# Patient Record
Sex: Female | Born: 1990
Health system: Southern US, Community
[De-identification: ages and names within clinical notes are randomized; demographics above are authoritative.]

---

## 2015-11-15 NOTE — L&D Delivery Note (Signed)
Operative Delivery Note At 10:43 AM a healthy female was delivered via Vaginal, Vacuum Investment banker, operational(Extractor).  Presentation: vertex; Position: Left,; Station: +4.  Easy application.  Pulls in safety zone over 2 UCs.  Successful assisted vaginal delivery.  Verbal consent: obtained from patient.  Risks and benefits discussed in detail.  Risks include, but are not limited to the risks of anesthesia, bleeding, infection, damage to maternal tissues, fetal cephalhematoma.  There is also the risk of inability to effect vaginal delivery of the head, or shoulder dystocia that cannot be resolved by established maneuvers, leading to the need for emergency cesarean section.  APGAR: 9, 9; weight 6 lb 15.5 oz (3160 g).   Placenta status: complete, 3 Vs.   Cord:  with the following complications: .  Cord pH: N/A  Anesthesia: Epidural Instruments: Bell Shape Vacuum Episiotomy: None Lacerations: 2nd degree Suture Repair: 2.0 3.0 vicryl rapide Est. Blood Loss (mL): 220  Mom to postpartum.  Baby to Couplet care / Skin to Skin.  Isaac Dubie,[name] 08/21/2016, 12:36 PM

## 2015-12-21 ENCOUNTER — Encounter (HOSPITAL_BASED_OUTPATIENT_CLINIC_OR_DEPARTMENT_OTHER): Payer: Self-pay | Admitting: *Deleted

## 2015-12-21 ENCOUNTER — Emergency Department (HOSPITAL_BASED_OUTPATIENT_CLINIC_OR_DEPARTMENT_OTHER): Payer: Self-pay

## 2015-12-21 ENCOUNTER — Emergency Department (HOSPITAL_BASED_OUTPATIENT_CLINIC_OR_DEPARTMENT_OTHER)
Admission: EM | Admit: 2015-12-21 | Discharge: 2015-12-21 | Disposition: A | Discharge status: Home / Self Care | Payer: Self-pay | Attending: Emergency Medicine | Admitting: Emergency Medicine

## 2015-12-21 DIAGNOSIS — N3000 Acute cystitis without hematuria: Secondary | ICD-10-CM | POA: Insufficient documentation

## 2015-12-21 DIAGNOSIS — Z331 Pregnant state, incidental: Secondary | ICD-10-CM | POA: Insufficient documentation

## 2015-12-21 DIAGNOSIS — R102 Pelvic and perineal pain: Secondary | ICD-10-CM

## 2015-12-21 DIAGNOSIS — R1110 Vomiting, unspecified: Secondary | ICD-10-CM | POA: Insufficient documentation

## 2015-12-21 DIAGNOSIS — O26899 Other specified pregnancy related conditions, unspecified trimester: Secondary | ICD-10-CM

## 2015-12-21 LAB — BASIC METABOLIC PANEL
Anion gap: 11 (ref 5–15)
BUN: 7 mg/dL (ref 6–20)
CO2: 22 mmol/L (ref 22–32)
Calcium: 8.9 mg/dL (ref 8.9–10.3)
Chloride: 104 mmol/L (ref 101–111)
Creatinine, Ser: 0.6 mg/dL (ref 0.44–1.00)
GFR calc Af Amer: >60 mL/min (ref >60)
GFR calc non Af Amer: >60 mL/min (ref >60)
Glucose, Bld: 88 mg/dL (ref 65–99)
Potassium: 3.5 mmol/L (ref 3.5–5.1)
Sodium: 137 mmol/L (ref 135–145)

## 2015-12-21 LAB — CBC WITH DIFFERENTIAL/PLATELET
Basophils Absolute: 0 10*3/uL (ref 0.0–0.1)
Basophils Relative: 0 %
Eosinophils Absolute: 0 10*3/uL (ref 0.0–0.7)
Eosinophils Relative: 0 %
HCT: 37.9 % (ref 36.0–46.0)
Hemoglobin: 12.5 g/dL (ref 12.0–15.0)
Lymphocytes Relative: 15 %
Lymphs Abs: 1.2 10*3/uL (ref 0.7–4.0)
MCH: 27.2 pg (ref 26.0–34.0)
MCHC: 33 g/dL (ref 30.0–36.0)
MCV: 82.6 fL (ref 78.0–100.0)
Monocytes Absolute: 0.5 10*3/uL (ref 0.1–1.0)
Monocytes Relative: 6 %
Neutro Abs: 6.1 10*3/uL (ref 1.7–7.7)
Neutrophils Relative %: 79 %
Platelets: 231 10*3/uL (ref 150–400)
RBC: 4.59 MIL/uL (ref 3.87–5.11)
RDW: 12.5 % (ref 11.5–15.5)
WBC: 7.8 10*3/uL (ref 4.0–10.5)

## 2015-12-21 LAB — URINALYSIS, ROUTINE W REFLEX MICROSCOPIC
Glucose, UA: NEGATIVE mg/dL
HGB URINE DIPSTICK: NEGATIVE
Ketones, ur: >80 mg/dL — AB
NITRITE: POSITIVE — AB
PROTEIN: NEGATIVE mg/dL
Specific Gravity, Urine: 1.026 (ref 1.005–1.030)
pH: 6 (ref 5.0–8.0)

## 2015-12-21 LAB — URINE MICROSCOPIC-ADD ON

## 2015-12-21 LAB — PREGNANCY, URINE: PREG TEST UR: POSITIVE — AB

## 2015-12-21 LAB — HCG, QUANTITATIVE, PREGNANCY: hCG, Beta Chain, Quant, S: 65990 m[IU]/mL — ABNORMAL HIGH (ref <5)

## 2015-12-21 MED ORDER — ACETAMINOPHEN 325 MG PO TABS
650.0000 mg | ORAL_TABLET | Freq: Once | ORAL | Status: AC
  Administered 2015-12-21: 650 mg via ORAL
  Filled 2015-12-21: qty 2

## 2015-12-21 MED ORDER — CEPHALEXIN 500 MG PO CAPS: 1000.0000 mg | ORAL_CAPSULE | Freq: Two times a day (BID) | ORAL | Status: DC

## 2015-12-21 MED ORDER — SODIUM CHLORIDE 0.9 % IV BOLUS (SEPSIS)
1000.0000 mL | Freq: Once | INTRAVENOUS | Status: AC
  Administered 2015-12-21: 1000 mL via INTRAVENOUS

## 2015-12-21 MED FILL — CEPHALEXIN 500 MG CAPSULE: 500 | 7 days supply | Qty: 28 | Fill #0

## 2015-12-21 NOTE — ED Notes (Signed)
Patient transported to Ultrasound 

## 2015-12-21 NOTE — ED Notes (Signed)
PT c/o of abd pain. PA aware. Orders given.

## 2015-12-21 NOTE — ED Notes (Signed)
Color started AFTER starting taking AZO tablets for painful urination.

## 2015-12-21 NOTE — Discharge Instructions (Signed)
Return here as needed.  Follow-up with your OB/GYN or the one provided

## 2015-12-21 NOTE — ED Provider Notes (Signed)
CSN: [id number]     Arrival date & time 12/21/15  1004 History   First MD Initiated Contact with Patient 12/21/15 1044     No chief complaint on file.    (Consider location/radiation/quality/duration/timing/severity/associated sxs/prior Treatment) HPI Patient presents to the emergency department with lower abdominal discomfort with dysuria.  Patient states that ongoing for 10 days.  Patient states that she also has had some vomiting in the mornings as well.  Patient denies chest pain, shortness breath, back pain, neck pain, fever, incontinence, hematemesis, bloody stool, edema, vaginal bleeding, vaginal discharge or syncope.  The patient states that nothing seems make her condition better or worse.  She did not take any medications at home  History reviewed. No pertinent past medical history. History reviewed. No pertinent past surgical history. No family history on file. Social History  Substance Use Topics  . Smoking status: Never Smoker   . Smokeless tobacco: None  . Alcohol Use: None   OB History    No data available     Review of Systems All other systems negative except as documented in the HPI. All pertinent positives and negatives as reviewed in the HPI.   Allergies  Review of patient's allergies indicates no known allergies.  Home Medications   Prior to Admission medications   Not on File   BP 98/51 mmHg  Pulse 89  Temp(Src) 98.2 F (36.8 C) (Oral)  Resp 18  Ht  (1.727 m)  Wt 46.811 kg  BMI 15.70 kg/m2  SpO2 100%  LMP 11/09/2015 Physical Exam  Constitutional: She is oriented to person, place, and time. She appears well-developed and well-nourished. No distress.  HENT:  Head: Normocephalic and atraumatic.  Mouth/Throat: Oropharynx is clear and moist.  Eyes: Pupils are equal, round, and reactive to light.  Neck: Normal range of motion. Neck supple.  Cardiovascular: Normal rate, regular rhythm and normal heart sounds.  Exam reveals no gallop and no  friction rub.   No murmur heard. Pulmonary/Chest: Effort normal and breath sounds normal. No respiratory distress. She has no wheezes.  Abdominal: Soft. Bowel sounds are normal. She exhibits no distension. There is tenderness. There is no rebound and no guarding.  Neurological: She is alert and oriented to person, place, and time. She exhibits normal muscle tone. Coordination normal.  Skin: Skin is warm and dry. No rash noted. No erythema.  Psychiatric: She has a normal mood and affect. Her behavior is normal.  Nursing note and vitals reviewed.   ED Course  Procedures (including critical care time) Labs Review Labs Reviewed  URINALYSIS, ROUTINE W REFLEX MICROSCOPIC (NOT AT [facility]) - Abnormal; Notable for the following:    Color, Urine RED (*)    APPearance CLOUDY (*)    Bilirubin Urine SMALL (*)    Ketones, ur >80 (*)    Nitrite POSITIVE (*)    Leukocytes, UA MODERATE (*)    All other components within normal limits  PREGNANCY, URINE - Abnormal; Notable for the following:    Preg Test, Ur POSITIVE (*)    All other components within normal limits  URINE MICROSCOPIC-ADD ON - Abnormal; Notable for the following:    Squamous Epithelial / LPF 6-30 (*)    Bacteria, UA MANY (*)    All other components within normal limits  HCG, QUANTITATIVE, PREGNANCY - Abnormal; Notable for the following:    hCG, Beta Chain, Quant, S 29528 (*)    All other components within normal limits  URINE CULTURE  CBC WITH  DIFFERENTIAL/PLATELET  BASIC METABOLIC PANEL    Imaging Review US Ob Comp Less 14 Wks  12/21/2015  CLINICAL DATA:  Dysuria for 10 days EXAM: OBSTETRIC <14 WK [place] AND TRANSVAGINAL OB US TECHNIQUE: Both transabdominal and transvaginal ultrasound examinations were performed for complete evaluation of the gestation as well as the maternal uterus, adnexal regions, and pelvic cul-de-sac. Transvaginal technique was performed to assess early pregnancy. COMPARISON:  None. FINDINGS: Intrauterine  gestational sac: Visualized/normal in shape. Yolk sac:  Visualized Embryo:  Visualized Cardiac Activity: Visualized Heart Rate: 116  bpm MSD:   mm    w     d CRL:  4.7  mm   6 w   1 d                  [place] EDC: 08/14/2016 Subchorionic hemorrhage:  No subchorionic hemorrhage. Maternal uterus/adnexae: No adnexal masses.  Trace free fluid. IMPRESSION: Six week 1 day intrauterine pregnancy. Fetal heart rate 160 beats per minute. No acute maternal findings. Electronically Signed   By: Charlett Nose M.D.   On: 12/21/2015 12:44   US Ob Transvaginal  12/21/2015  CLINICAL DATA:  Dysuria for 10 days EXAM: OBSTETRIC <14 WK [place] AND TRANSVAGINAL OB US TECHNIQUE: Both transabdominal and transvaginal ultrasound examinations were performed for complete evaluation of the gestation as well as the maternal uterus, adnexal regions, and pelvic cul-de-sac. Transvaginal technique was performed to assess early pregnancy. COMPARISON:  None. FINDINGS: Intrauterine gestational sac: Visualized/normal in shape. Yolk sac:  Visualized Embryo:  Visualized Cardiac Activity: Visualized Heart Rate: 116  bpm MSD:   mm    w     d CRL:  4.7  mm   6 w   1 d                  [place] EDC: 08/14/2016 Subchorionic hemorrhage:  No subchorionic hemorrhage. Maternal uterus/adnexae: No adnexal masses.  Trace free fluid. IMPRESSION: Six week 1 day intrauterine pregnancy. Fetal heart rate 160 beats per minute. No acute maternal findings. Electronically Signed   By: Charlett Nose M.D.   On: 12/21/2015 12:44   I have personally reviewed and evaluated these images and lab results as part of my medical decision-making. The patient will be sent to [facility] for further care and evaluation of her intrauterine pregnancy.  The patient as well as the plan and all questions were answered    Charlestine Night, PA-C 12/21/15 1355  Tilden Fossa, MD 12/22/15 [phone]

## 2015-12-21 NOTE — ED Notes (Signed)
C/o burning on urination and abd pain with n/v x 3 days.

## 2015-12-22 LAB — URINE CULTURE: Culture: 4000

## 2016-02-02 ENCOUNTER — Encounter: Payer: Self-pay | Admitting: Certified Nurse Midwife

## 2016-02-10 LAB — OB RESULTS CONSOLE GC/CHLAMYDIA
Chlamydia: NEGATIVE
Gonorrhea: NEGATIVE

## 2016-05-16 LAB — OB RESULTS CONSOLE ABO/RH: RH Type: POSITIVE

## 2016-05-16 LAB — OB RESULTS CONSOLE RPR: RPR: NONREACTIVE

## 2016-05-16 LAB — OB RESULTS CONSOLE ANTIBODY SCREEN: ANTIBODY SCREEN: NEGATIVE

## 2016-05-16 LAB — OB RESULTS CONSOLE HIV ANTIBODY (ROUTINE TESTING): HIV: NONREACTIVE

## 2016-05-16 LAB — OB RESULTS CONSOLE HEPATITIS B SURFACE ANTIGEN: HEP B S AG: NEGATIVE

## 2016-05-16 LAB — OB RESULTS CONSOLE RUBELLA ANTIBODY, IGM: RUBELLA: IMMUNE

## 2016-06-20 ENCOUNTER — Inpatient Hospital Stay (HOSPITAL_COMMUNITY): Admission: AD | Admit: 2016-06-20 | Payer: Self-pay | Source: Ambulatory Visit | Admitting: Obstetrics & Gynecology

## 2016-07-20 LAB — OB RESULTS CONSOLE GBS: GBS: NEGATIVE

## 2016-08-19 ENCOUNTER — Other Ambulatory Visit: Payer: Self-pay | Admitting: Obstetrics & Gynecology

## 2016-08-20 ENCOUNTER — Encounter (HOSPITAL_COMMUNITY): Payer: Self-pay | Admitting: *Deleted

## 2016-08-20 ENCOUNTER — Inpatient Hospital Stay (HOSPITAL_COMMUNITY): Payer: PRIVATE HEALTH INSURANCE | Admitting: Anesthesiology

## 2016-08-20 ENCOUNTER — Inpatient Hospital Stay (HOSPITAL_COMMUNITY)
Admission: AD | Admit: 2016-08-20 | Discharge: 2016-08-22 | DRG: 775 | Disposition: A | Discharge status: Home / Self Care | Payer: PRIVATE HEALTH INSURANCE | Source: Ambulatory Visit | Attending: Obstetrics & Gynecology | Admitting: Obstetrics & Gynecology

## 2016-08-20 ENCOUNTER — Inpatient Hospital Stay (HOSPITAL_COMMUNITY): Admission: RE | Admit: 2016-08-20 | Payer: PRIVATE HEALTH INSURANCE | Source: Ambulatory Visit

## 2016-08-20 DIAGNOSIS — Z3A40 40 weeks gestation of pregnancy: Secondary | ICD-10-CM

## 2016-08-20 DIAGNOSIS — O480 Post-term pregnancy: Principal | ICD-10-CM | POA: Diagnosis present

## 2016-08-20 DIAGNOSIS — Z8759 Personal history of other complications of pregnancy, childbirth and the puerperium: Secondary | ICD-10-CM

## 2016-08-20 LAB — TYPE AND SCREEN
ABO/RH(D): O POS
ANTIBODY SCREEN: NEGATIVE

## 2016-08-20 LAB — CBC
HEMATOCRIT: 38.9 % (ref 36.0–46.0)
HEMOGLOBIN: 13 g/dL (ref 12.0–15.0)
MCH: 29.2 pg (ref 26.0–34.0)
MCHC: 33.4 g/dL (ref 30.0–36.0)
MCV: 87.4 fL (ref 78.0–100.0)
Platelets: 189 10*3/uL (ref 150–400)
RBC: 4.45 MIL/uL (ref 3.87–5.11)
RDW: 17.7 % — ABNORMAL HIGH (ref 11.5–15.5)
WBC: 8.8 10*3/uL (ref 4.0–10.5)

## 2016-08-20 LAB — ABO/RH: ABO/RH(D): O POS

## 2016-08-20 MED ORDER — LACTATED RINGERS IV SOLN: 500.0000 mL | Freq: Once | INTRAVENOUS | Status: DC

## 2016-08-20 MED ORDER — LIDOCAINE HCL (PF) 1 % IJ SOLN
INTRAMUSCULAR | Status: DC | PRN
  Administered 2016-08-20 (×2): 5 mL

## 2016-08-20 MED ORDER — LIDOCAINE HCL (PF) 1 % IJ SOLN
30.0000 mL | INTRAMUSCULAR | Status: DC | PRN
  Administered 2016-08-21: 30 mL via SUBCUTANEOUS
  Filled 2016-08-20: qty 30

## 2016-08-20 MED ORDER — OXYTOCIN 40 UNITS IN LACTATED RINGERS INFUSION - SIMPLE MED
1.0000 m[IU]/min | INTRAVENOUS | Status: DC
  Administered 2016-08-20: 2 m[IU]/min via INTRAVENOUS
  Filled 2016-08-20: qty 1000

## 2016-08-20 MED ORDER — DIPHENHYDRAMINE HCL 50 MG/ML IJ SOLN
12.5000 mg | INTRAMUSCULAR | Status: DC | PRN
Start: 2016-08-20 — End: 2016-08-20

## 2016-08-20 MED ORDER — EPHEDRINE 5 MG/ML INJ: 10.0000 mg | INTRAVENOUS | Status: DC | PRN

## 2016-08-20 MED ORDER — FENTANYL 2.5 MCG/ML BUPIVACAINE 1/10 % EPIDURAL INFUSION (WH - ANES)
14.0000 mL/h | INTRAMUSCULAR | Status: DC | PRN
  Administered 2016-08-20 – 2016-08-21 (×2): 14 mL/h via EPIDURAL
  Filled 2016-08-20 (×2): qty 125

## 2016-08-20 MED ORDER — LACTATED RINGERS IV SOLN
INTRAVENOUS | Status: DC
  Administered 2016-08-20 (×2): via INTRAVENOUS

## 2016-08-20 MED ORDER — PHENYLEPHRINE 40 MCG/ML (10ML) SYRINGE FOR IV PUSH (FOR BLOOD PRESSURE SUPPORT)
80.0000 ug | PREFILLED_SYRINGE | INTRAVENOUS | Status: DC | PRN
  Filled 2016-08-20: qty 5

## 2016-08-20 MED ORDER — PHENYLEPHRINE 40 MCG/ML (10ML) SYRINGE FOR IV PUSH (FOR BLOOD PRESSURE SUPPORT): 80.0000 ug | PREFILLED_SYRINGE | INTRAVENOUS | Status: DC | PRN

## 2016-08-20 MED ORDER — OXYTOCIN 40 UNITS IN LACTATED RINGERS INFUSION - SIMPLE MED
2.5000 [IU]/h | Freq: Once | INTRAVENOUS | Status: AC | PRN
  Administered 2016-08-21: 2.5 [IU]/h via INTRAVENOUS
  Filled 2016-08-20: qty 1000

## 2016-08-20 MED ORDER — FENTANYL 2.5 MCG/ML BUPIVACAINE 1/10 % EPIDURAL INFUSION (WH - ANES): 14.0000 mL/h | INTRAMUSCULAR | Status: DC | PRN

## 2016-08-20 MED ORDER — OXYTOCIN BOLUS FROM INFUSION
500.0000 mL | Freq: Once | INTRAVENOUS | Status: AC | PRN
  Administered 2016-08-21: 500 mL via INTRAVENOUS

## 2016-08-20 MED ORDER — SOD CITRATE-CITRIC ACID 500-334 MG/5ML PO SOLN: 30.0000 mL | ORAL | Status: DC | PRN

## 2016-08-20 MED ORDER — PHENYLEPHRINE 40 MCG/ML (10ML) SYRINGE FOR IV PUSH (FOR BLOOD PRESSURE SUPPORT)
80.0000 ug | PREFILLED_SYRINGE | INTRAVENOUS | Status: DC | PRN
  Filled 2016-08-20: qty 10
  Filled 2016-08-20: qty 5

## 2016-08-20 MED ORDER — ACETAMINOPHEN 325 MG PO TABS: 650.0000 mg | ORAL_TABLET | ORAL | Status: DC | PRN

## 2016-08-20 MED ORDER — DIPHENHYDRAMINE HCL 50 MG/ML IJ SOLN: 12.5000 mg | INTRAMUSCULAR | Status: DC | PRN

## 2016-08-20 MED ORDER — ONDANSETRON HCL 4 MG/2ML IJ SOLN
4.0000 mg | Freq: Four times a day (QID) | INTRAMUSCULAR | Status: DC | PRN
  Administered 2016-08-21: 4 mg via INTRAVENOUS
  Filled 2016-08-20: qty 2

## 2016-08-20 MED ORDER — TERBUTALINE SULFATE 1 MG/ML IJ SOLN
0.2500 mg | Freq: Once | INTRAMUSCULAR | Status: DC | PRN
  Filled 2016-08-20: qty 1

## 2016-08-20 MED ORDER — EPHEDRINE 5 MG/ML INJ
10.0000 mg | INTRAVENOUS | Status: DC | PRN
  Filled 2016-08-20: qty 4

## 2016-08-20 MED ORDER — LACTATED RINGERS IV SOLN: 500.0000 mL | INTRAVENOUS | Status: DC | PRN

## 2016-08-20 NOTE — H&P (Signed)
Joan Mccarthy is a 25 y.o. female G1P0 2558w5d presenting for Induction Postdates.  HPP/HPI:  Good FMs, no AF leak, no vaginal bleeding, no reg UCs.  No PEC Sx.  Last Ob US 40+ wks AFI wnl, BPP 8/8.  US 39+ wks EFW 47%.  OB History    Gravida Para Term Preterm AB Living   1             SAB TAB Ectopic Multiple Live Births                 History reviewed. No pertinent past medical history. No past surgical history on file. Family History: family history is not on file. Social History:  reports that she has never smoked. She does not have any smokeless tobacco history on file. She reports that she does not drink alcohol or use drugs.  No Known Allergies  Dilation: 2.5 Effacement (%): 80 Station: -2 Exam by:: [email or web address],rnc-ob   Blood pressure 107/63, pulse 92, temperature 98 F (36.7 C), temperature source Oral, resp. rate 20, height 4\' 10"  (1.473 m), weight 139 lb (63 kg), last menstrual period 11/09/2015. Exam Physical Exam   FHR 130's with good variability, accelerations present, no deceleration. UCs irregular, mild.  HPP:  Patient Active Problem List   Diagnosis Date Noted  . Labor and delivery, indication for care 08/20/2016    Prenatal labs: ABO, Rh: O/Positive/-- (07/03 0000) Antibody: Negative (07/03 0000) Rubella: Immune RPR: Nonreactive (07/03 0000)  HBsAg: Negative (07/03 0000)  HIV: Non-reactive (07/03 0000)  Genetic testing: Quad test neg US anato: wnl, female 1 hr GTT: Abnormal.  3 hr GTT wnl. GBS: Negative (09/06 0000)   Assessment/Plan: 40 5/7 wks G1 for Induction Postdates.  FHR Cat 1.  Pitocin/AROM.  Expectant management towards vaginal delivery.  Risks of C/S previously discussed with Dr Juliene PinaMody given borderline Pelvis.   Joan Mccarthy,Joan Mccarthy 08/20/2016, 8:56 AM

## 2016-08-20 NOTE — Anesthesia Pain Management Evaluation Note (Signed)
  CRNA Pain Management Visit Note  Patient: Kandice MoosSowjanya Barro, 25 y.o., female  "Hello I am a member of the anesthesia team at [facility]. We have an anesthesia team available at all times to provide care throughout the hospital, including epidural management and anesthesia for C-section. I don't know your plan for the delivery whether it a natural birth, water birth, IV sedation, nitrous supplementation, doula or epidural, but we want to meet your pain goals."   1.Was your pain managed to your expectations on prior hospitalizations?    2.What is your expectation for pain management during this hospitalization?     3.How can we help you reach that goal?   Record the patient's initial score and the patient's pain goal.   Pain: 0  Pain Goal: 5 [facility] wants you to be able to say your pain was always managed very well.  Zayveon Raschke Hristova 08/20/2016

## 2016-08-20 NOTE — Anesthesia Procedure Notes (Signed)
Epidural Patient location during procedure: OB Start time: 08/20/2016 10:42 PM End time: 08/20/2016 10:45 PM  Staffing Anesthesiologist: Bonita QuinGUIDETTI, Huntley Knoop S Performed: anesthesiologist   Preanesthetic Checklist Completed: patient identified, site marked, surgical consent, pre-op evaluation, timeout performed, IV checked, risks and benefits discussed and monitors and equipment checked  Epidural Patient position: sitting Prep: site prepped and draped and DuraPrep Patient monitoring: continuous pulse ox and blood pressure Approach: midline Location: L4-L5 Injection technique: LOR air  Needle:  Needle type: Tuohy  Needle gauge: 17 G Needle length: 9 cm and 9 Needle insertion depth: 6 cm Catheter type: closed end flexible Catheter size: 19 Gauge Catheter at skin depth: 11 cm Test dose: negative  Assessment Events: blood not aspirated, injection not painful, no injection resistance, negative IV test and no paresthesia

## 2016-08-20 NOTE — Progress Notes (Signed)
Subjective: Doing well, pain very mile, UCs q3-4 min  Anesthesia none   Objective: BP (!) 112/59   Pulse 90   Temp 98.4 F (36.9 C) (Oral)   Resp 18   Ht 4\' 10"  (1.473 m)   Wt 139 lb (63 kg)   LMP 11/09/2015   BMI 29.05 kg/m    FHT:  FHR: 130's bpm, variability: moderate,  accelerations:  Present,  decelerations:  Absent UC:   regular, every 3-4 minutes VE:   Dilation: 3 Effacement (%): 80 Station: -2 Exam by:: dr Seymour BarsLavoie   Assessment / Plan: Induction of labor due to postterm,  progressing well on pitocin.  Early labor.  Will reevaluate for AROM at next exam.  Patient prefers not to AROM at this time.  Fetal Wellbeing:  Category I Pain Control:  Labor support without medications/Nitrous Oxide PRN/Epidural PRN.  Anticipated MOD:  NSVD  Latha Staunton,[name] 08/20/2016, 12:09 PM

## 2016-08-20 NOTE — Anesthesia Preprocedure Evaluation (Signed)
Anesthesia Evaluation  Patient identified by MRN, date of birth, ID band Patient awake    Reviewed: Allergy & Precautions, NPO status , Patient's Chart, lab work & pertinent test results  Airway Mallampati: II       Dental no notable dental hx.    Pulmonary neg pulmonary ROS,    Pulmonary exam normal        Cardiovascular negative cardio ROS Normal cardiovascular exam     Neuro/Psych negative neurological ROS  negative psych ROS   GI/Hepatic negative GI ROS, Neg liver ROS,   Endo/Other  negative endocrine ROS  Renal/GU negative Renal ROS  negative genitourinary   Musculoskeletal negative musculoskeletal ROS (+)   Abdominal   Peds negative pediatric ROS (+)  Hematology negative hematology ROS (+)   Anesthesia Other Findings   Reproductive/Obstetrics (+) Pregnancy                             Anesthesia Physical Anesthesia Plan  ASA: II  Anesthesia Plan: Epidural   Post-op Pain Management:    Induction:   Airway Management Planned:   Additional Equipment:   Intra-op Plan:   Post-operative Plan:   Informed Consent:   Plan Discussed with:   Anesthesia Plan Comments:         Anesthesia Quick Evaluation  

## 2016-08-20 NOTE — Progress Notes (Signed)
Subjective: Doing well, pain mild, UCs q3-4  Anesthesia none   Objective: BP 107/61   Pulse 100   Temp 98.6 F (37 C) (Oral)   Resp 18   Ht 4\' 10"  (1.473 m)   Wt 139 lb (63 kg)   LMP 11/09/2015   BMI 29.05 kg/m    FHT:  FHR: 130's bpm, variability: moderate,  accelerations:  Present,  decelerations:  Absent UC:   regular, every 3-4 minutes VE:   Dilation: 3 Effacement (%): 90 Station: -1, -2 Exam by:: dr Seymour Barslavoie  AROM AF clear ++   Assessment / Plan: Induction with Pitocin, still in early labor.  AROM clear AF.  Fetal Wellbeing:  Category I Pain Control:  Labor support without medications  Anticipated MOD:  NSVD  Briley Sulton,[name] 08/20/2016, 4:15 PM

## 2016-08-21 ENCOUNTER — Encounter (HOSPITAL_COMMUNITY): Payer: Self-pay | Admitting: *Deleted

## 2016-08-21 LAB — RPR: RPR: NONREACTIVE

## 2016-08-21 MED ORDER — DIPHENHYDRAMINE HCL 25 MG PO CAPS: 25.0000 mg | ORAL_CAPSULE | Freq: Four times a day (QID) | ORAL | Status: DC | PRN

## 2016-08-21 MED ORDER — METHYLERGONOVINE MALEATE 0.2 MG PO TABS
0.2000 mg | ORAL_TABLET | Freq: Once | ORAL | Status: AC
  Administered 2016-08-21: 0.2 mg via ORAL
  Filled 2016-08-21: qty 1

## 2016-08-21 MED ORDER — TETANUS-DIPHTH-ACELL PERTUSSIS 5-2.5-18.5 LF-MCG/0.5 IM SUSP
0.5000 mL | Freq: Once | INTRAMUSCULAR | Status: AC
  Administered 2016-08-22: 0.5 mL via INTRAMUSCULAR

## 2016-08-21 MED ORDER — ONDANSETRON HCL 4 MG PO TABS: 4.0000 mg | ORAL_TABLET | ORAL | Status: DC | PRN

## 2016-08-21 MED ORDER — COCONUT OIL OIL: 1.0000 "application " | TOPICAL_OIL | Status: DC | PRN

## 2016-08-21 MED ORDER — DIBUCAINE 1 % RE OINT: 1.0000 "application " | TOPICAL_OINTMENT | RECTAL | Status: DC | PRN

## 2016-08-21 MED ORDER — OXYTOCIN 40 UNITS IN LACTATED RINGERS INFUSION - SIMPLE MED: 2.5000 [IU]/h | INTRAVENOUS | Status: DC | PRN

## 2016-08-21 MED ORDER — IBUPROFEN 600 MG PO TABS
600.0000 mg | ORAL_TABLET | Freq: Four times a day (QID) | ORAL | Status: DC
  Administered 2016-08-21 – 2016-08-22 (×5): 600 mg via ORAL
  Filled 2016-08-21 (×6): qty 1

## 2016-08-21 MED ORDER — OXYTOCIN 40 UNITS IN LACTATED RINGERS INFUSION - SIMPLE MED: 1.0000 m[IU]/min | INTRAVENOUS | Status: DC

## 2016-08-21 MED ORDER — SIMETHICONE 80 MG PO CHEW: 80.0000 mg | CHEWABLE_TABLET | ORAL | Status: DC | PRN

## 2016-08-21 MED ORDER — WITCH HAZEL-GLYCERIN EX PADS: 1.0000 "application " | MEDICATED_PAD | CUTANEOUS | Status: DC | PRN

## 2016-08-21 MED ORDER — ACETAMINOPHEN 325 MG PO TABS: 650.0000 mg | ORAL_TABLET | ORAL | Status: DC | PRN

## 2016-08-21 MED ORDER — SENNOSIDES-DOCUSATE SODIUM 8.6-50 MG PO TABS
2.0000 | ORAL_TABLET | ORAL | Status: DC
  Administered 2016-08-21: 2 via ORAL
  Filled 2016-08-21: qty 2

## 2016-08-21 MED ORDER — BENZOCAINE-MENTHOL 20-0.5 % EX AERO
1.0000 "application " | INHALATION_SPRAY | CUTANEOUS | Status: DC | PRN
  Administered 2016-08-22: 1 via TOPICAL
  Filled 2016-08-21 (×2): qty 56

## 2016-08-21 MED ORDER — ONDANSETRON HCL 4 MG/2ML IJ SOLN: 4.0000 mg | INTRAMUSCULAR | Status: DC | PRN

## 2016-08-21 MED ORDER — METHYLERGONOVINE MALEATE 0.2 MG/ML IJ SOLN
0.2000 mg | Freq: Once | INTRAMUSCULAR | Status: AC
  Administered 2016-08-21: 0.2 mg via INTRAMUSCULAR

## 2016-08-21 MED ORDER — PRENATAL MULTIVITAMIN CH
1.0000 | ORAL_TABLET | Freq: Every day | ORAL | Status: DC
  Administered 2016-08-21 – 2016-08-22 (×2): 1 via ORAL
  Filled 2016-08-21 (×2): qty 1

## 2016-08-21 MED ORDER — ZOLPIDEM TARTRATE 5 MG PO TABS: 5.0000 mg | ORAL_TABLET | Freq: Every evening | ORAL | Status: DC | PRN

## 2016-08-21 NOTE — Progress Notes (Signed)
Epidural decreased to 7010ml/hr per dr Council Mechanicdenenny = dr Seymour Barslavoie aware

## 2016-08-21 NOTE — Progress Notes (Signed)
I went over admission paper work with mother and told her to sign it and she refused and said she didn't want to sign it until she read all the way through it and discussed it with her husbands.

## 2016-08-21 NOTE — Progress Notes (Signed)
Mother still was a little weak upon taking her to the restroom , she walked all the way to the bathroom but got weak and needed a steady upon getting into the restroom. Told patient to call out for help next time she needs to void.

## 2016-08-21 NOTE — Progress Notes (Signed)
Subjective: Doing well, pain controled, UCs q3 min  Anesthesia epidural   Objective: BP 117/78   Pulse (!) 108   Temp 97.6 F (36.4 C) (Axillary)   Resp 18   Ht 4\' 10"  (1.473 m)   Wt 139 lb (63 kg)   LMP 11/09/2015   SpO2 100%   BMI 29.05 kg/m    FHT:  FHR: 130's bpm, variability: moderate,  accelerations:  Present,  decelerations:  Present Mild variable decelerations with some UCs UC:   regular, every 3 minutes VE:   Dilation: 10 Effacement (%): 100 Station: +3 Exam by:: Dr. Seymour BarsLavoie  Occiput ant.  Pelvis adequat with good pubic angle.  A little Platypelloid. Pushing on-off x 6:30 am.  Starting to feel vaginal pressure after decreasing epidural.  Still having difficulty pushing efficiently.  Continuing to increase Pito, UCs suboptimal.   Assessment / Plan: 2nd stage with slow descent probably due to suboptimal UCs and difficulty with pushing without feeling vaginal pressure much.  Will attempt pushing for maximum 1 more hour as long as FHR Cat 1.  Recommend assisted delivery with Vacuum then if delivery not imminent.  Patient and husband agree.  Fetal Wellbeing:  Category I Pain Control:  Epidural  Anticipated MOD:  NSVD  Joan Mccarthy,Joan Mccarthy 08/21/2016, 9:18 AM

## 2016-08-21 NOTE — Lactation Note (Signed)
This note was copied from a baby's chart. Lactation Consultation Note  Patient Name: Joan Mccarthy WUJWJ'XToday's Date: 08/21/2016 Reason for consult: Follow-up assessment;Difficult latch  Baby 12 hours old. Assisted mom with hand expression and baby given 1 ml of EBM by spoon. Baby tolerated well. Once mom saw that she had colostrum flowing, assisted mom to latch baby to left breast in football position. Baby latched deeply and suckled rhythmically with some swallows noted. Discussed with mom several times how to support breast to maintain deep latch and not pull breast back because this would cause the baby to slip to the tip of the nipple. Mom reports increased comfort with deeper latch. Enc mom to use EBM on nipples for healing. Enc mom to hand express before latching so the baby can taste EBM and open his mouth wide to latch deeply.  Maternal Data Has patient been taught Hand Expression?: Yes Does the patient have breastfeeding experience prior to this delivery?: No  Feeding Feeding Type: Breast Fed Length of feed:  (LC assessed first 10 minutes of BF. )  LATCH Score/Interventions Latch: Grasps breast easily, tongue down, lips flanged, rhythmical sucking. Intervention(s): Adjust position;Assist with latch;Breast compression  Audible Swallowing: A few with stimulation Intervention(s): Skin to skin;Hand expression  Type of Nipple: Everted at rest and after stimulation  Comfort (Breast/Nipple): Filling, red/small blisters or bruises, mild/mod discomfort  Problem noted: Mild/Moderate discomfort Interventions (Mild/moderate discomfort): Hand expression  Hold (Positioning): Assistance needed to correctly position infant at breast and maintain latch. Intervention(s): Breastfeeding basics reviewed;Support Pillows;Position options;Skin to skin  LATCH Score: 7  Lactation Tools Discussed/Used     Consult Status Consult Status: Follow-up Date: 08/22/16 Follow-up type:  In-patient    Sherlyn HayJennifer D Harshal Sirmon 08/21/2016, 11:16 PM

## 2016-08-21 NOTE — Lactation Note (Signed)
This note was copied from a baby's chart. Lactation Consultation Note  Patient Name: Joan Mccarthy GNFAO'ZToday's Date: 08/21/2016 Reason for consult: Initial assessment Baby 10 hours old. Offered to obtain interpreter, but parents declined. Asked mom if baby latching/nursing well, and mom reported that baby latching well, but she is not sure if baby getting enough at the breast. Assisted mom with hand expression and mom has colostrum flowing well bilaterally. Parents report that baby just finished nursing, and is about to be bathed, so offered to return after the bath and assist with hand expression and spoon-feeding and then latching the baby.   Mom had questions about supplementing with formula. Discussed the benefits of breast milk and the risks to breastfeeding caused by offering formula. Enc exclusive breastfeeding unless medically necessary to offer formula. Parents requested that this LC return later to assist with feeding the baby.  Mom given [facility] brochure, aware of OP/BFSG and LC phone line assistance after D/C.   Maternal Data Has patient been taught Hand Expression?: Yes Does the patient have breastfeeding experience prior to this delivery?: No  Feeding    LATCH Score/Interventions                      Lactation Tools Discussed/Used     Consult Status Consult Status: Follow-up Date: 08/21/16 Follow-up type: In-patient    Sherlyn HayJennifer D Sharlynn Mccarthy 08/21/2016, 8:58 PM

## 2016-08-21 NOTE — Progress Notes (Signed)
Subjective: Doing well, pain controled, UCs q3 min  Anesthesia epidural   Objective: BP (!) 106/51   Pulse 98   Temp 98 F (36.7 C) (Axillary)   Resp 18   Ht 4\' 10"  (1.473 m)   Wt 139 lb (63 kg)   LMP 11/09/2015   SpO2 100%   BMI 29.05 kg/m    FHT:  FHR: 130's bpm, variability: moderate,  accelerations:  Present,  decelerations:  Present Mild variable decelerations with UCs UC:   regular, every 3 minutes VE:   Dilation: 8 Effacement (%): 100 Station: +2 Exam by:: Dr. Seymour BarsLavoie   Assessment / Plan: Induction of labor due to postterm,  progressing well on pitocin.  Fetal Wellbeing:  Category I Pain Control:  Epidural  Anticipated MOD:  NSVD  Darrien Belter,[name] 08/21/2016, 3:51 AM

## 2016-08-22 DIAGNOSIS — Z8759 Personal history of other complications of pregnancy, childbirth and the puerperium: Secondary | ICD-10-CM

## 2016-08-22 LAB — CBC
HCT: 32.4 % — ABNORMAL LOW (ref 36.0–46.0)
Hemoglobin: 10.7 g/dL — ABNORMAL LOW (ref 12.0–15.0)
MCH: 29.1 pg (ref 26.0–34.0)
MCHC: 33 g/dL (ref 30.0–36.0)
MCV: 88 fL (ref 78.0–100.0)
PLATELETS: 145 10*3/uL — AB (ref 150–400)
RBC: 3.68 MIL/uL — AB (ref 3.87–5.11)
RDW: 18.3 % — AB (ref 11.5–15.5)
WBC: 11 10*3/uL — ABNORMAL HIGH (ref 4.0–10.5)

## 2016-08-22 MED ORDER — IBUPROFEN 600 MG PO TABS: 600.0000 mg | ORAL_TABLET | Freq: Four times a day (QID) | ORAL | 0 refills | Status: AC

## 2016-08-22 MED ORDER — PRENATAL MULTIVITAMIN CH: 1.0000 | ORAL_TABLET | Freq: Every day | ORAL | Status: AC

## 2016-08-22 MED ORDER — BENZOCAINE-MENTHOL 20-0.5 % EX AERO: 1.0000 "application " | INHALATION_SPRAY | CUTANEOUS | Status: AC | PRN

## 2016-08-22 NOTE — Progress Notes (Signed)
UR chart review completed.  

## 2016-08-22 NOTE — Lactation Note (Signed)
This note was copied from a baby's chart. Lactation Consultation Note  Patient Name: Joan Mccarthy ZOXWR'UToday's Date: 08/22/2016 Reason for consult: Follow-up assessment Mom called out for breastfeeding assist.  Family member helping mom but baby not latching.  Mom c/o nipple soreness and pain with feeding.  Both nipples intact.  Baby wrapped in blankets with 2 hats on and the room is very warm.  Baby undressed and placed skin to skin with mom.  Baby is sleepy and showing no feeding cues but good suck on gloved finger.  Baby placed in football hold but not opening mouth to latch.  20 mm nipple shield applied and baby suckled on and off for a few minutes then fell asleep.  Parents agreeable to using small amounts of formula.  Nipples shield filled with formula and baby took formula but would not continue feeding.  Baby bottle fed with slow flow nipple and took 13 mls well.  Symphony pump set up and initiated.  Mom pumped for 15 minutes and obtained drops which were given to baby with a gloved finger.  Reviewed supply and demand and the importance of pumping every 3 hours if baby has a poor feeding at breast.  Coconut oil given for mom to use on nipples after feeding.  Instructed to call for assist with feeding cues.  Maternal Data    Feeding Feeding Type: Breast Fed  LATCH Score/Interventions Latch: Too sleepy or reluctant, no latch achieved, no sucking elicited. Intervention(s): Adjust position;Assist with latch;Breast massage;Breast compression  Audible Swallowing: None Intervention(s): Skin to skin;Hand expression Intervention(s): Alternate breast massage  Type of Nipple: Everted at rest and after stimulation (short)  Comfort (Breast/Nipple): Filling, red/small blisters or bruises, mild/mod discomfort  Problem noted: Mild/Moderate discomfort  Hold (Positioning): Assistance needed to correctly position infant at breast and maintain latch. Intervention(s): Breastfeeding basics  reviewed;Support Pillows;Position options;Skin to skin  LATCH Score: 4  Lactation Tools Discussed/Used Tools: Nipple Shields Nipple shield size: 20   Consult Status      Laresa Oshiro S 08/22/2016, 11:41 AM

## 2016-08-22 NOTE — Lactation Note (Signed)
This note was copied from a baby'Mccarthy chart. Lactation Consultation Note  Patient Name: Joan Kandice MoosSowjanya Khalid UJWJX'BToday'Mccarthy Date: 08/22/2016 Reason for consult: Follow-up assessment;Difficult latch Assisted mom with breastfeeding.  Baby is showing more interest in latching.  Baby has a shallow latch after several attempts to correct.  Mom uncomfortable with feeding.  20 mm nipple shield applied and baby nursed for 6 minutes but latch still not deep.  Mom more comfortable with nipple shield.  Assisted mom with DEBP.  Instructed to put baby to the breast first, then post pump for 15 minutes and supplement the baby with 10-15 mls of expressed milk/formula by bottle every 3 hours.  Maternal Data    Feeding Feeding Type: Breast Fed Length of feed: 6 min  LATCH Score/Interventions Latch: Repeated attempts needed to sustain latch, nipple held in mouth throughout feeding, stimulation needed to elicit sucking reflex. Intervention(Mccarthy): Adjust position;Assist with latch;Breast massage;Breast compression  Audible Swallowing: None  Type of Nipple: Everted at rest and after stimulation  Comfort (Breast/Nipple): Filling, red/small blisters or bruises, mild/mod discomfort  Problem noted: Mild/Moderate discomfort  Hold (Positioning): Assistance needed to correctly position infant at breast and maintain latch.  LATCH Score: 5  Lactation Tools Discussed/Used Tools: Nipple Shields Nipple shield size: 20   Consult Status Consult Status: Follow-up Date: 08/23/16 Follow-up type: In-patient    Huston FoleyMOULDEN, Joan Mccarthy 08/22/2016, 3:52 PM

## 2016-08-22 NOTE — Discharge Summary (Signed)
Obstetric Discharge Summary Reason for Admission: induction of labor Prenatal Procedures: ultrasound Intrapartum Procedures: vacuum Postpartum Procedures: none Complications-Operative and Postpartum: 2nd degree perineal laceration Hemoglobin  Date Value Ref Range Status  08/22/2016 10.7 (L) 12.0 - 15.0 g/dL Final   HCT  Date Value Ref Range Status  08/22/2016 32.4 (L) 36.0 - 46.0 % Final    Physical Exam:  General: alert, cooperative and no distress Lochia: appropriate Uterine Fundus: firm Incision: healing well, no significant erythema DVT Evaluation: No cords or calf tenderness. No significant calf/ankle edema.  Discharge Diagnoses: Term Pregnancy-delivered  Discharge Information: Date: 08/22/2016 Activity: pelvic rest Diet: routine Medications: PNV and Ibuprofen Condition: stable Instructions: refer to practice specific booklet Discharge to: home Follow-up Information    MODY,VAISHALI R, MD. Schedule an appointment as soon as possible for a visit in 6 week(s).   Specialty:  Obstetrics and Gynecology Why:  Postpartum visit Contact information: [place] [place] [id number] [phone]           Newborn Data: Live born female Joan Mccarthy Birth Weight: 6 lb 15.5 oz (3160 g) APGAR: 9, 9  Home with mother.  Joan Mccarthy 08/22/2016, 10:45 AM

## 2016-08-22 NOTE — Progress Notes (Signed)
PPD # 1 VAVD Information for the patient's newborn:  Candida PeelingGundu, Boy Verda [[id number]][[id number]]  female    breast feeding   Baby name: Saivedansh  S:  Reports feeling very sore, "legs do not work after epidural". Requesting discharge home today.             Tolerating po/ No nausea or vomiting             Bleeding is light             Pain controlled with PO meds             Up ad lib / ambulatory / voiding without difficulties        O:  A & O x 3, in no apparent distress              VS:  Vitals:   08/21/16 1351 08/21/16 1555 08/21/16 2000 08/22/16 0445  BP: (!) 93/45 (!) 90/50 122/68 124/63  Pulse: 88 85 84 82  Resp: 18 18 18 18   Temp: 98 F (36.7 C) 98.3 F (36.8 C) 98.4 F (36.9 C) 98.6 F (37 C)  TempSrc:      SpO2: 98% 98% 99% 100%  Weight:      Height:        LABS:  Recent Labs  08/20/16 0750 08/22/16 0553  WBC 8.8 11.0*  HGB 13.0 10.7*  HCT 38.9 32.4*  PLT 189 145*    Blood type: --/--/O POS, O POS (10/07 0750)  Rubella: Immune (07/03 0000)   I&O: I/O last 3 completed shifts: In: -  Out: 2939 [Urine:2400; Blood:539]          No intake/output data recorded.  Lungs: Clear and unlabored  Heart: regular rate and rhythm / no murmurs  Abdomen: soft, non-tender, non-distended             Fundus: firm, non-tender, U-1  Perineum: repair intact  Lochia: small  Extremities: no edema, no calf pain or tenderness, FROM    A/P: PPD # 1 25 y.o., G1P1001   Principal Problem:   Status post vacuum-assisted vaginal delivery (10/8) Active Problems:   Postpartum state   Doing well - stable status  Routine post partum orders  Discussed anticipatory guidance for PP recovery, advised sore muscles normal after labor, expect improvement with activity, encouraged OOB for short walks.   May DC home today pending peds dispo of newborn.     Neta Mendsaniela C Britney Captain, MSN, CNM 08/22/2016, 10:20 AM

## 2016-08-22 NOTE — Lactation Note (Signed)
This note was copied from a baby's chart. Lactation Consultation Note FOB requested LC to come to room.  MGM attempting to feed baby a bottle with half nipple in mouth and baby asleep with no sucking.  LC pointed out baby needs to be awakened for feedings and burped to continue rest of feeding.  MGM burped baby who was very sleepy and changed dirty diaper.   LC reviewed feeding plan with family.  Mom to feed baby with early feeding cues, wake baby every 2 1/2-3 hours from beginning of feeding.  Mom to allow STS to wake baby if needed.  Mom to use NS to protect sore nipples.  Mom to have helper bottle feed baby while mom is pumping with DEBP.   LC discussed that mom may not see colostrum from pumping as normal.  LC encouraged mom to also work on hand expression.  Right nipple red with cracking noted and applied expressed colostrum.  Mom to call RN for assist if baby is not taking at least 8mls with every feeding. [facility]  LC will need to discuss d/c pumping plan as parents do not have DEBP for home use.   Encouragement given and all questions answered.    Patient Name: Joan Mccarthy ZOXWR'UToday's Date: 08/22/2016 Reason for consult: Follow-up assessment;Difficult latch   Maternal Data    Feeding Feeding Type: Breast Fed Length of feed: 6 min  LATCH Score/Interventions Latch: Repeated attempts needed to sustain latch, nipple held in mouth throughout feeding, stimulation needed to elicit sucking reflex. Intervention(s): Adjust position;Assist with latch;Breast massage;Breast compression  Audible Swallowing: None  Type of Nipple: Everted at rest and after stimulation  Comfort (Breast/Nipple): Filling, red/small blisters or bruises, mild/mod discomfort  Problem noted: Mild/Moderate discomfort  Hold (Positioning): Assistance needed to correctly position infant at breast and maintain latch.  LATCH Score: 5  Lactation Tools  Discussed/Used Tools: Nipple Shields Nipple shield size: 20   Consult Status Consult Status: Follow-up Date: 08/23/16 Follow-up type: In-patient    Anisten Tomassi, Arvella MerlesJana Lynn 08/22/2016, 4:30 PM

## 2016-08-22 NOTE — Anesthesia Postprocedure Evaluation (Signed)
Anesthesia Post Note  Patient: Joan Mccarthy  Procedure(s) Performed: * No procedures listed *  Patient location during evaluation: Mother Baby Anesthesia Type: Epidural Level of consciousness: awake and alert Pain management: pain level controlled Vital Signs Assessment: post-procedure vital signs reviewed and stable Respiratory status: spontaneous breathing, nonlabored ventilation and respiratory function stable Cardiovascular status: stable Postop Assessment: no headache, no backache and epidural receding Anesthetic complications: no Comments: Pt ambulatory. C/O numbness medial and front lower calf. Dr Gentry RochJudd to followup and evaluate.     Last Vitals:  Vitals:   08/21/16 2000 08/22/16 0445  BP: 122/68 124/63  Pulse: 84 82  Resp: 18 18  Temp: 36.9 C 37 C    Last Pain:  Vitals:   08/22/16 0915  TempSrc:   PainSc: 0-No pain   Pain Goal: Patients Stated Pain Goal: 8 (08/20/16 [id number])               Marrion CoyMERRITT,Adonai Selsor

## 2016-08-23 ENCOUNTER — Ambulatory Visit: Payer: Self-pay

## 2016-08-23 NOTE — Lactation Note (Signed)
This note was copied from a baby's chart. Lactation Consultation Note  Patient Name: Joan Mccarthy'UToday's Date: 08/23/2016 Reason for consult: Follow-up assessment Baby at 46 hr of life. Upon entry baby was laying flat on his back on a pillow in mom's lap asleep. She was trying to make the sleeping baby drink from the bottle. Reviewed feeding cues and paced bottle feeding. Mom stated she wants to bf but the MD told her to offer formula for 3 days. Discussed breast changes/engorgement treatment/prevention and maintaining a robust milk supply. Mom reports bilateral sore nipples. She does have dark brown vertical compression stripes on both nipples. Discussed nipple care. Parents have not contacted insurance company about a breast pump, they plan to buy one on the way home. Reviewed breast milk and formula safe hygiene/handling/storage. Mom declined OP apt, [facility] Parents are aware of support group. Mom will offer the breast 8+/24 hr. If her nipples are too sore the latch, she will use DEBP to supply her milk. If she is unable to pump enough milk she will offer formula. Parents will contact lactation as needed.   Maternal Data    Feeding Feeding Type: Breast Fed  LATCH Score/Interventions                      Lactation Tools Discussed/Used     Consult Status Consult Status: Complete Date: 08/23/16 Follow-up type: Call as needed    Rulon Eisenmengerlizabeth E Ambar Raphael 08/23/2016, 9:07 AM

## 2016-08-25 IMAGING — US US OB TRANSVAGINAL
1 series · 14 of 28 positions shown · non-contrast
Comparison: None.

CLINICAL DATA: Dysuria for 10 days

EXAM:
OBSTETRIC <14 WK US AND TRANSVAGINAL OB US
TECHNIQUE: Both transabdominal and transvaginal ultrasound examinations were
performed for complete evaluation of the gestation as well as the
maternal uterus, adnexal regions, and pelvic cul-de-sac.
Transvaginal technique was performed to assess early pregnancy.

[Series 1: us ob transvaginal · 0.18mm/px · 14 of 45 slices shown]
[im 2/45]
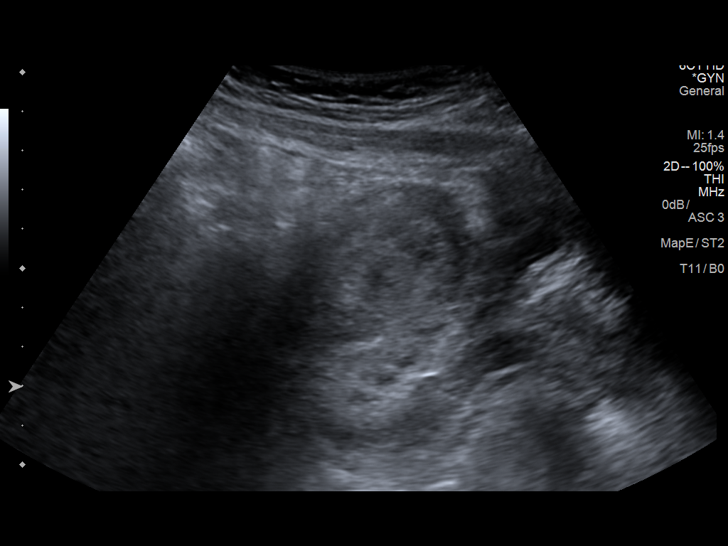
[im 5/45]
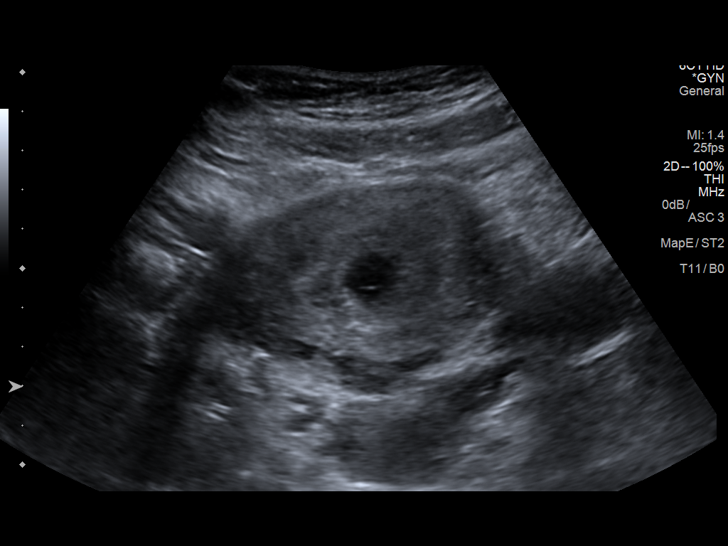
[im 9/45]
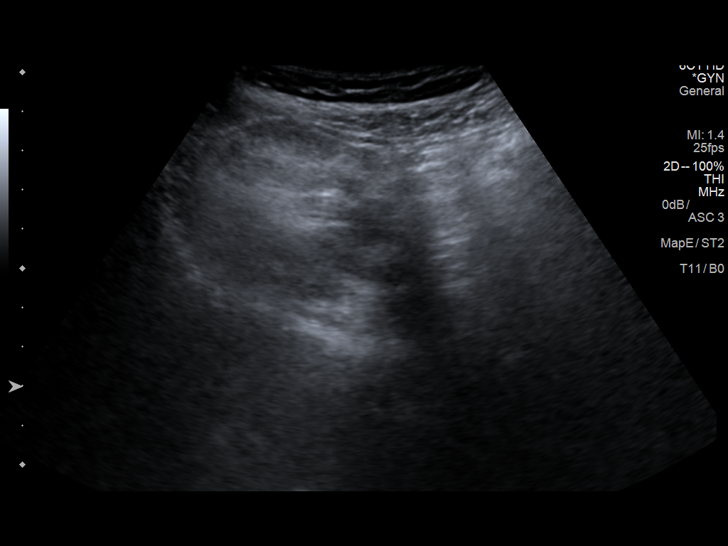
[im 12/45]
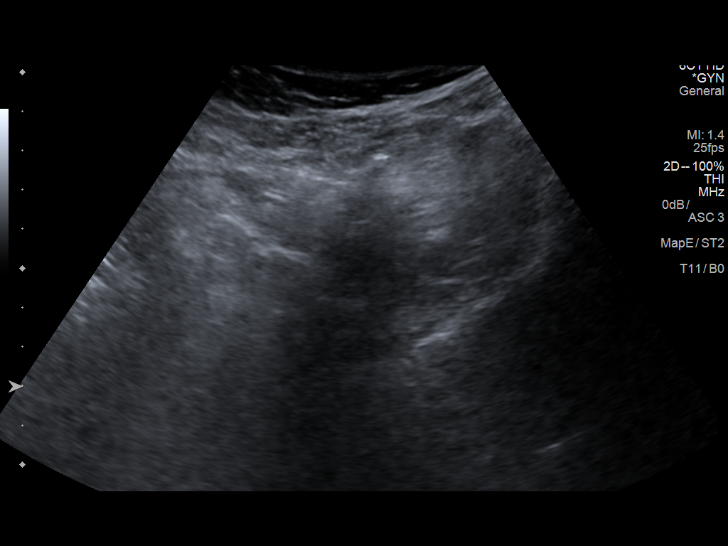
[im 15/45]
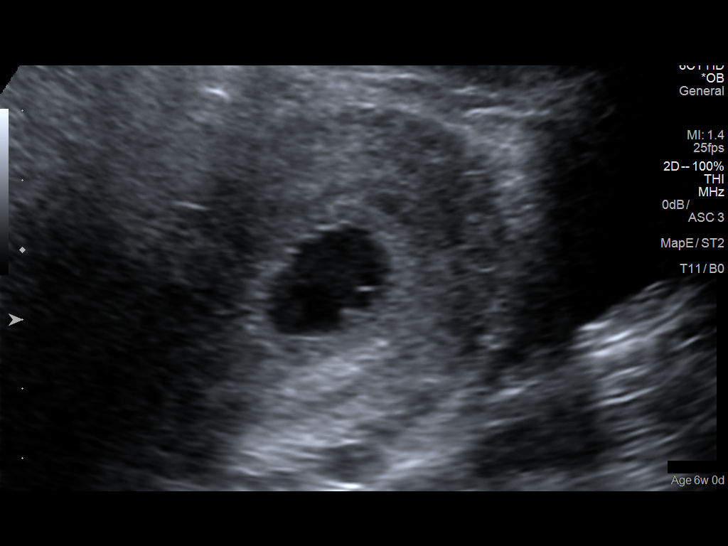
[im 18/45]
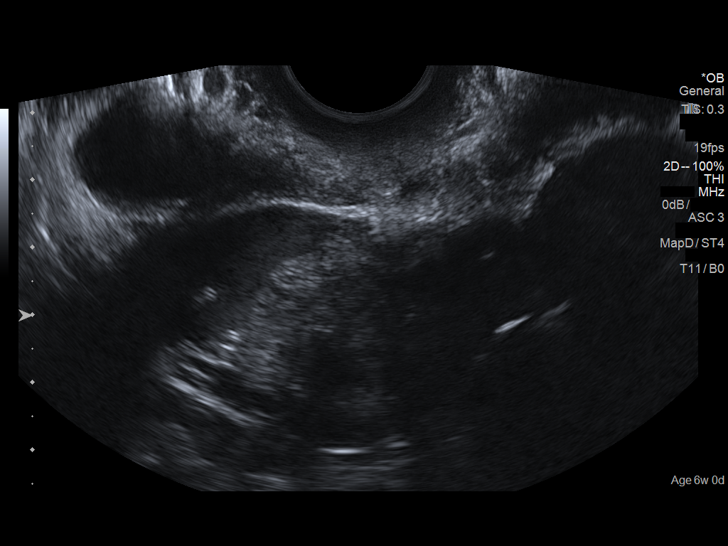
[im 22/45]
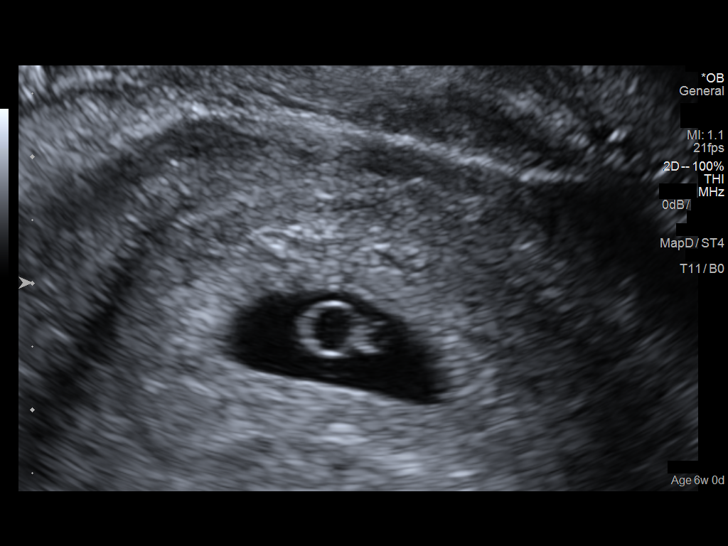
[im 25/45]
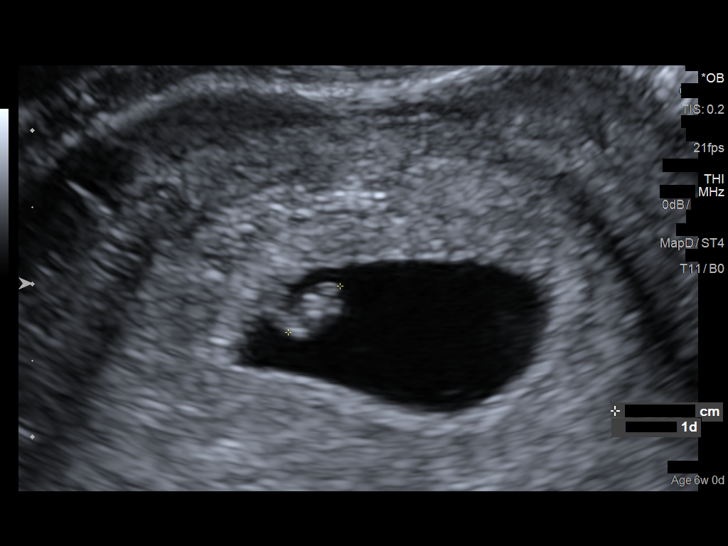
[im 28/45]
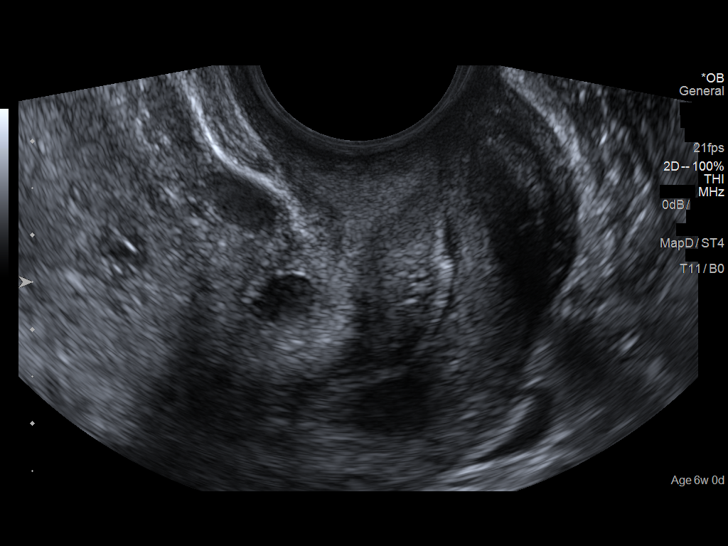
[im 31/45]
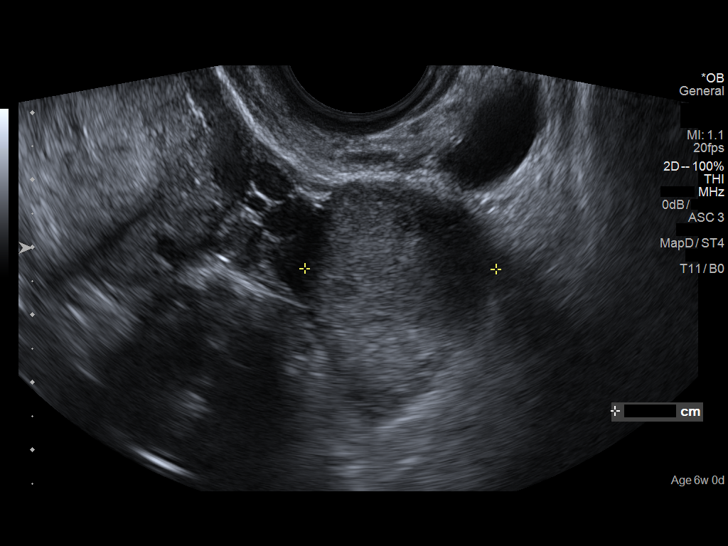
[im 35/45]
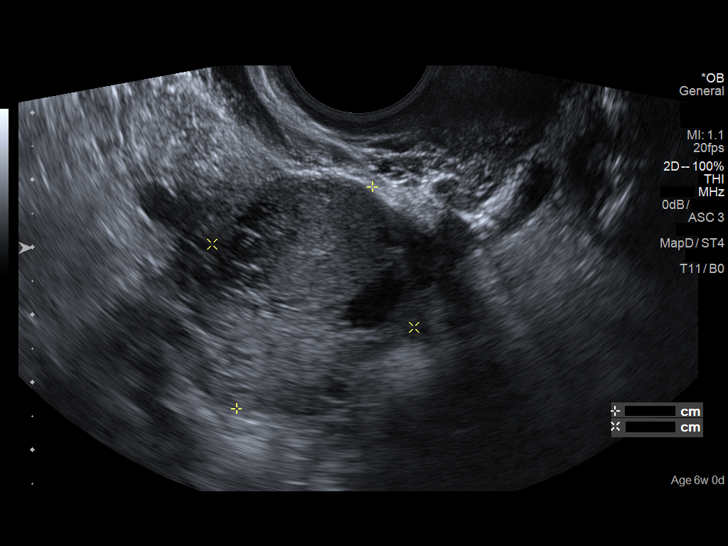
[im 38/45]
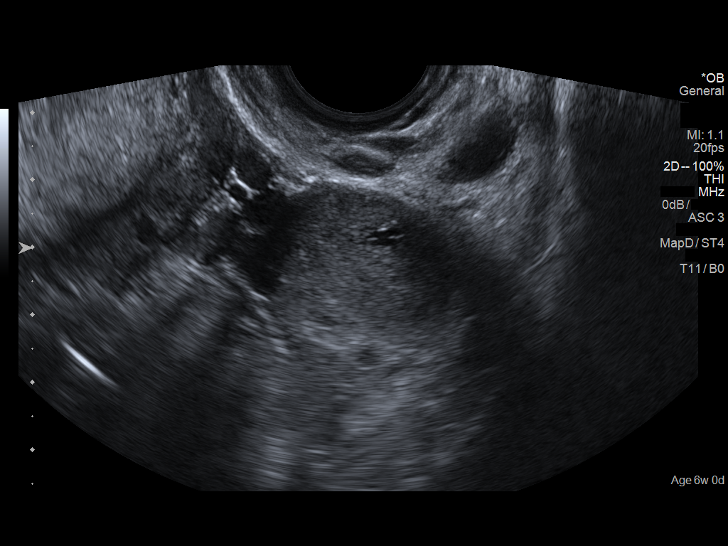
[im 41/45]
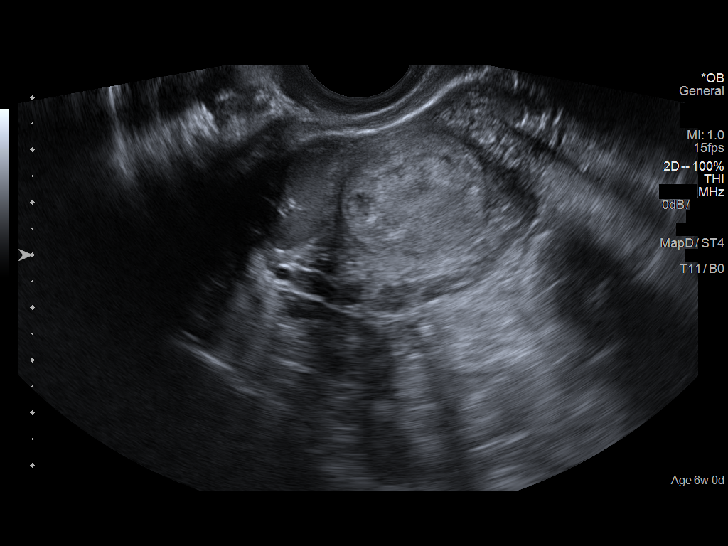
[im 45/45]
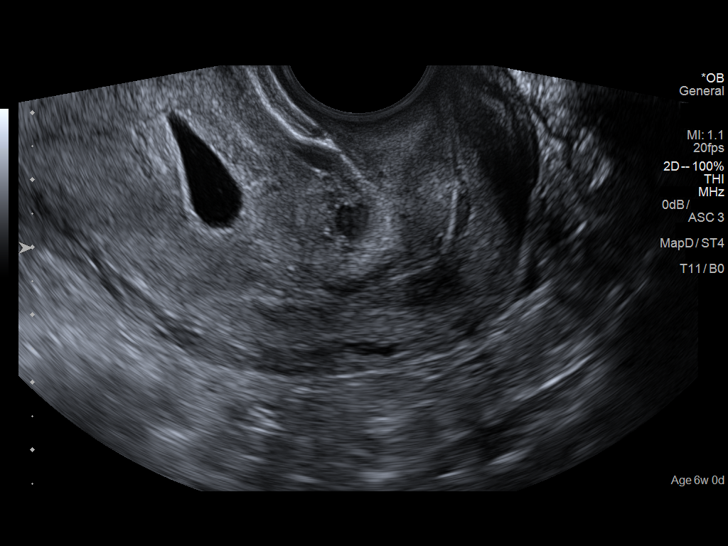

[14 of 28 positions shown; findings below may reference images not displayed]

FINDINGS: Intrauterine gestational sac: Visualized/normal in shape.

Yolk sac:  Visualized

Embryo:  Visualized

Cardiac Activity: Visualized

Heart Rate: 116  bpm

MSD:   mm    w     d

CRL:  4.7  mm   6 w   1 d                  US EDC: 08/14/2016

Subchorionic hemorrhage:  No subchorionic hemorrhage.

Maternal uterus/adnexae: No adnexal masses.  Trace free fluid.
IMPRESSION: Six week 1 day intrauterine pregnancy. Fetal heart rate 160 beats
per minute. No acute maternal findings.
